# Patient Record
Sex: Male | Born: 1960 | Race: Black or African American | Hispanic: No | Marital: Single | Smoking: Former smoker
Health system: Southern US, Community
[De-identification: ages and names within clinical notes are randomized; demographics above are authoritative.]

## PROBLEM LIST (undated history)

## (undated) DIAGNOSIS — F1911 Other psychoactive substance abuse, in remission: Secondary | ICD-10-CM

## (undated) DIAGNOSIS — J45909 Unspecified asthma, uncomplicated: Secondary | ICD-10-CM

## (undated) DIAGNOSIS — J449 Chronic obstructive pulmonary disease, unspecified: Secondary | ICD-10-CM

## (undated) DIAGNOSIS — F32A Depression, unspecified: Secondary | ICD-10-CM

## (undated) DIAGNOSIS — G8929 Other chronic pain: Secondary | ICD-10-CM

## (undated) DIAGNOSIS — K219 Gastro-esophageal reflux disease without esophagitis: Secondary | ICD-10-CM

## (undated) DIAGNOSIS — K635 Polyp of colon: Secondary | ICD-10-CM

## (undated) DIAGNOSIS — I10 Essential (primary) hypertension: Secondary | ICD-10-CM

## (undated) DIAGNOSIS — F329 Major depressive disorder, single episode, unspecified: Secondary | ICD-10-CM

## (undated) DIAGNOSIS — Z87828 Personal history of other (healed) physical injury and trauma: Secondary | ICD-10-CM

## (undated) DIAGNOSIS — M542 Cervicalgia: Secondary | ICD-10-CM

## (undated) HISTORY — PX: CERVICAL FUSION: SHX112

## (undated) HISTORY — PX: OTHER SURGICAL HISTORY: SHX169

---

## 2006-06-06 ENCOUNTER — Ambulatory Visit: Payer: Self-pay | Admitting: Family Medicine

## 2006-06-08 ENCOUNTER — Ambulatory Visit: Payer: Self-pay | Admitting: *Deleted

## 2006-07-28 ENCOUNTER — Ambulatory Visit: Payer: Self-pay | Admitting: Family Medicine

## 2006-08-08 ENCOUNTER — Ambulatory Visit: Payer: Self-pay | Admitting: Family Medicine

## 2006-09-01 ENCOUNTER — Ambulatory Visit: Payer: Self-pay | Admitting: Internal Medicine

## 2007-02-01 ENCOUNTER — Emergency Department (HOSPITAL_COMMUNITY): Admission: EM | Admit: 2007-02-01 | Discharge: 2007-02-01 | Payer: Self-pay | Admitting: *Deleted

## 2007-02-13 ENCOUNTER — Ambulatory Visit: Payer: Self-pay | Admitting: Internal Medicine

## 2007-02-13 LAB — CONVERTED CEMR LAB
ALT: 22 units/L (ref 0–53)
AST: 14 units/L (ref 0–37)
Alkaline Phosphatase: 105 units/L (ref 39–117)
Basophils Absolute: 0 10*3/uL (ref 0.0–0.1)
Basophils Relative: 0 % (ref 0–1)
Chloride: 110 meq/L (ref 96–112)
Creatinine, Ser: 0.98 mg/dL (ref 0.40–1.50)
Eosinophils Absolute: 0.1 10*3/uL (ref 0.0–0.7)
HDL: 51 mg/dL (ref >39)
Hemoglobin: 13.4 g/dL (ref 13.0–17.0)
LDL Cholesterol: 103 mg/dL — ABNORMAL HIGH (ref 0–99)
MCHC: 30.9 g/dL (ref 30.0–36.0)
MCV: 91.2 fL (ref 78.0–100.0)
Monocytes Absolute: 0.5 10*3/uL (ref 0.2–0.7)
Neutro Abs: 5.7 10*3/uL (ref 1.7–7.7)
RDW: 13.2 % (ref 11.5–14.0)
Total Bilirubin: 0.5 mg/dL (ref 0.3–1.2)
Total CHOL/HDL Ratio: 3.5
VLDL: 26 mg/dL (ref 0–40)

## 2007-02-22 ENCOUNTER — Encounter (INDEPENDENT_AMBULATORY_CARE_PROVIDER_SITE_OTHER): Payer: Self-pay | Admitting: *Deleted

## 2007-04-17 ENCOUNTER — Ambulatory Visit: Payer: Self-pay | Admitting: Internal Medicine

## 2007-07-17 ENCOUNTER — Ambulatory Visit: Payer: Self-pay | Admitting: Internal Medicine

## 2007-08-25 ENCOUNTER — Emergency Department (HOSPITAL_COMMUNITY): Admission: EM | Admit: 2007-08-25 | Discharge: 2007-08-25 | Payer: Self-pay | Admitting: Emergency Medicine

## 2007-09-25 ENCOUNTER — Ambulatory Visit: Payer: Self-pay | Admitting: Internal Medicine

## 2007-09-25 ENCOUNTER — Ambulatory Visit (HOSPITAL_COMMUNITY): Admission: RE | Admit: 2007-09-25 | Discharge: 2007-09-25 | Payer: Self-pay | Admitting: Family Medicine

## 2007-10-05 ENCOUNTER — Emergency Department (HOSPITAL_COMMUNITY): Admission: EM | Admit: 2007-10-05 | Discharge: 2007-10-05 | Payer: Self-pay | Admitting: Emergency Medicine

## 2007-11-02 ENCOUNTER — Ambulatory Visit: Payer: Self-pay | Admitting: Internal Medicine

## 2007-12-18 ENCOUNTER — Emergency Department (HOSPITAL_COMMUNITY): Admission: EM | Admit: 2007-12-18 | Discharge: 2007-12-18 | Payer: Self-pay | Admitting: Emergency Medicine

## 2007-12-28 ENCOUNTER — Ambulatory Visit: Admission: RE | Admit: 2007-12-28 | Discharge: 2007-12-28 | Payer: Self-pay | Admitting: Internal Medicine

## 2007-12-28 ENCOUNTER — Ambulatory Visit: Payer: Self-pay | Admitting: Internal Medicine

## 2008-03-12 ENCOUNTER — Ambulatory Visit: Payer: Self-pay | Admitting: Internal Medicine

## 2008-05-15 ENCOUNTER — Emergency Department (HOSPITAL_COMMUNITY): Admission: EM | Admit: 2008-05-15 | Discharge: 2008-05-15 | Payer: Self-pay | Admitting: Emergency Medicine

## 2008-06-04 ENCOUNTER — Ambulatory Visit: Payer: Self-pay | Admitting: Internal Medicine

## 2008-07-24 ENCOUNTER — Encounter: Admission: RE | Admit: 2008-07-24 | Discharge: 2008-09-05 | Payer: Self-pay | Admitting: Internal Medicine

## 2008-07-24 ENCOUNTER — Ambulatory Visit: Payer: Self-pay | Admitting: Internal Medicine

## 2008-07-24 ENCOUNTER — Encounter (INDEPENDENT_AMBULATORY_CARE_PROVIDER_SITE_OTHER): Payer: Self-pay | Admitting: Adult Health

## 2008-07-24 LAB — CONVERTED CEMR LAB
AST: 20 units/L (ref 0–37)
Albumin: 4.5 g/dL (ref 3.5–5.2)
Alkaline Phosphatase: 110 units/L (ref 39–117)
LDL Cholesterol: 127 mg/dL — ABNORMAL HIGH (ref 0–99)
PSA: 1.17 ng/mL (ref 0.10–4.00)
Potassium: 4.2 meq/L (ref 3.5–5.3)
Sodium: 141 meq/L (ref 135–145)
Total Bilirubin: 0.5 mg/dL (ref 0.3–1.2)
Total Protein: 7.2 g/dL (ref 6.0–8.3)
VLDL: 13 mg/dL (ref 0–40)

## 2008-10-02 ENCOUNTER — Ambulatory Visit: Payer: Self-pay | Admitting: Adult Health

## 2009-05-23 ENCOUNTER — Emergency Department (HOSPITAL_COMMUNITY): Admission: EM | Admit: 2009-05-23 | Discharge: 2009-05-24 | Payer: Self-pay | Admitting: Emergency Medicine

## 2009-11-12 ENCOUNTER — Ambulatory Visit: Payer: Self-pay | Admitting: Internal Medicine

## 2009-11-12 ENCOUNTER — Encounter (INDEPENDENT_AMBULATORY_CARE_PROVIDER_SITE_OTHER): Payer: Self-pay | Admitting: Adult Health

## 2009-11-12 LAB — CONVERTED CEMR LAB
Albumin: 4.5 g/dL (ref 3.5–5.2)
Alkaline Phosphatase: 116 units/L (ref 39–117)
BUN: 23 mg/dL (ref 6–23)
CO2: 23 meq/L (ref 19–32)
Cholesterol: 199 mg/dL (ref 0–200)
Glucose, Bld: 110 mg/dL — ABNORMAL HIGH (ref 70–99)
HDL: 49 mg/dL (ref >39)
LDL Cholesterol: 132 mg/dL — ABNORMAL HIGH (ref 0–99)
Potassium: 3.8 meq/L (ref 3.5–5.3)
Sodium: 138 meq/L (ref 135–145)
Total Bilirubin: 0.5 mg/dL (ref 0.3–1.2)
Total Protein: 7.6 g/dL (ref 6.0–8.3)
Triglycerides: 91 mg/dL (ref <150)

## 2009-12-01 ENCOUNTER — Ambulatory Visit (HOSPITAL_COMMUNITY): Admission: RE | Admit: 2009-12-01 | Discharge: 2009-12-01 | Payer: Self-pay | Admitting: Internal Medicine

## 2009-12-01 LAB — PULMONARY FUNCTION TEST

## 2009-12-31 ENCOUNTER — Ambulatory Visit: Payer: Self-pay | Admitting: Internal Medicine

## 2010-02-17 ENCOUNTER — Ambulatory Visit: Payer: Self-pay | Admitting: Internal Medicine

## 2010-09-07 LAB — GLUCOSE, CAPILLARY

## 2011-03-12 LAB — URINALYSIS, ROUTINE W REFLEX MICROSCOPIC
Glucose, UA: NEGATIVE mg/dL
Nitrite: NEGATIVE
Specific Gravity, Urine: 1.019 (ref 1.005–1.030)
pH: 7.5 (ref 5.0–8.0)

## 2011-03-12 LAB — URINE MICROSCOPIC-ADD ON

## 2011-03-19 LAB — DIFFERENTIAL
Eosinophils Relative: 0
Lymphocytes Relative: 13
Lymphs Abs: 1.1
Monocytes Relative: 7

## 2011-03-19 LAB — I-STAT 8, (EC8 V) (CONVERTED LAB)
BUN: 19
Chloride: 107
Glucose, Bld: 95
Potassium: 3.3 — ABNORMAL LOW
Sodium: 138
TCO2: 22
pH, Ven: 7.529 — ABNORMAL HIGH

## 2011-03-19 LAB — CBC
HCT: 38.6 — ABNORMAL LOW
Platelets: 213
RBC: 4.36
WBC: 7.9

## 2012-04-26 ENCOUNTER — Emergency Department (INDEPENDENT_AMBULATORY_CARE_PROVIDER_SITE_OTHER)
Admission: EM | Admit: 2012-04-26 | Discharge: 2012-04-26 | Disposition: A | Discharge status: Home / Self Care | Payer: Medicare Other | Source: Home / Self Care

## 2012-04-26 ENCOUNTER — Encounter (HOSPITAL_COMMUNITY): Payer: Self-pay

## 2012-04-26 DIAGNOSIS — I10 Essential (primary) hypertension: Secondary | ICD-10-CM

## 2012-04-26 DIAGNOSIS — J45909 Unspecified asthma, uncomplicated: Secondary | ICD-10-CM | POA: Insufficient documentation

## 2012-04-26 DIAGNOSIS — K635 Polyp of colon: Secondary | ICD-10-CM | POA: Insufficient documentation

## 2012-04-26 DIAGNOSIS — K219 Gastro-esophageal reflux disease without esophagitis: Secondary | ICD-10-CM

## 2012-04-26 DIAGNOSIS — F329 Major depressive disorder, single episode, unspecified: Secondary | ICD-10-CM

## 2012-04-26 DIAGNOSIS — G8929 Other chronic pain: Secondary | ICD-10-CM | POA: Insufficient documentation

## 2012-04-26 HISTORY — DX: Polyp of colon: K63.5

## 2012-04-26 HISTORY — DX: Gastro-esophageal reflux disease without esophagitis: K21.9

## 2012-04-26 HISTORY — DX: Depression, unspecified: F32.A

## 2012-04-26 HISTORY — DX: Essential (primary) hypertension: I10

## 2012-04-26 HISTORY — DX: Other chronic pain: G89.29

## 2012-04-26 HISTORY — DX: Unspecified asthma, uncomplicated: J45.909

## 2012-04-26 HISTORY — DX: Cervicalgia: M54.2

## 2012-04-26 HISTORY — DX: Major depressive disorder, single episode, unspecified: F32.9

## 2012-04-26 HISTORY — DX: Other psychoactive substance abuse, in remission: F19.11

## 2012-04-26 MED ORDER — RANITIDINE HCL 150 MG PO TABS: 150.0000 mg | ORAL_TABLET | Freq: Two times a day (BID) | ORAL | Status: AC

## 2012-04-26 MED ORDER — TADALAFIL 10 MG PO TABS: 10.0000 mg | ORAL_TABLET | Freq: Every day | ORAL | Status: AC | PRN

## 2012-04-26 MED ORDER — TRAMADOL HCL 50 MG PO TABS: 50.0000 mg | ORAL_TABLET | Freq: Four times a day (QID) | ORAL | Status: AC | PRN

## 2012-04-26 MED ORDER — DULOXETINE HCL 20 MG PO CPEP: 20.0000 mg | ORAL_CAPSULE | Freq: Every day | ORAL | Status: DC

## 2012-04-26 MED ORDER — TRAZODONE HCL 100 MG PO TABS: 100.0000 mg | ORAL_TABLET | Freq: Every day | ORAL | Status: AC

## 2012-04-26 MED ORDER — HYDROXYZINE HCL 25 MG PO TABS: 25.0000 mg | ORAL_TABLET | Freq: Three times a day (TID) | ORAL | Status: DC | PRN

## 2012-04-26 MED ORDER — QUETIAPINE FUMARATE 50 MG PO TABS: 50.0000 mg | ORAL_TABLET | Freq: Every day | ORAL | Status: DC

## 2012-04-26 NOTE — ED Provider Notes (Signed)
History     CSN: [id number]  Arrival date & time 04/26/12  4098   Chief Complaint  Patient presents with  . Medication Management    HPI Pt reports that he needs refills of his medications.  He has been in prison for last 11 months but says that he was going to [facility] prior to going to prison.    Pt also requesting prescription for cialis.  He says that he has been taking viagra but has been having headaches about 1 hour after taking drug.  Says that he has taken cialis in the past and had no headaches.    Past Medical History  Diagnosis Date  . Depressed   . Asthma     History reviewed.   History reviewed.    History  Substance Use Topics  . Smoking status: Not on file  . Smokeless tobacco: Not on file  . Alcohol Use:     Review of Systems  Constitutional: Negative.   HENT: Positive for neck stiffness.   Eyes: Negative.   Respiratory: Negative.   Cardiovascular: Negative.   Gastrointestinal: Negative.   Genitourinary: Negative for urgency, frequency, hematuria, scrotal swelling, genital sores and testicular pain.  Musculoskeletal:       Chronic neck pain and stiffness Chronic joint pain  Neurological: Negative.   Hematological: Negative.   Psychiatric/Behavioral: Negative.     Allergies  Review of patient's allergies indicates no known allergies.  Home Medications   Current Outpatient Rx  Name  Route  Sig  Dispense  Refill  . DULOXETINE HCL 30 MG PO CPEP   Oral   Take 30 mg by mouth daily.         Marland Kitchen FLUTICASONE-SALMETEROL 230-21 MCG/ACT IN AERO   Inhalation   Inhale 2 puffs into the lungs 2 (two) times daily.         . TRAMADOL HCL 50 MG PO TABS   Oral   Take 50 mg by mouth every 6 (six) hours as needed.         . TRAZODONE HCL 100 MG PO TABS   Oral   Take 100 mg by mouth at bedtime.           BP 142/97  Pulse 82  Temp 98 F (36.7 C) (Oral)  Resp 18  SpO2 97%  Physical Exam  Constitutional: He is oriented to person, place,  and time. He appears well-developed and well-nourished. No distress.  HENT:  Head: Normocephalic and atraumatic.  Eyes: Pupils are equal, round, and reactive to light.  Neck: No tracheal deviation present. No thyromegaly present.  Cardiovascular: Normal rate and regular rhythm.   Pulmonary/Chest: Effort normal and breath sounds normal.  Abdominal: Soft. Bowel sounds are normal. He exhibits no distension and no mass. There is no tenderness. There is no rebound and no guarding.  Musculoskeletal: He exhibits tenderness.       Neck and joint tenderness    Neurological: He is alert and oriented to person, place, and time. He has normal reflexes. No cranial nerve deficit. Coordination normal.  Skin: Skin is warm and dry. No rash noted. No erythema.  Psychiatric: He has a normal mood and affect. His behavior is normal. Judgment and thought content normal.    ED Course  Procedures (including critical care time)  Labs Reviewed - No data to display No results found.   No diagnosis found.  MDM   IMPRESSION  Chronic Neck Pain and Knee Pain  Chronic Insomnia  Asthma  S/p cervical spine fusion  Chronic OA  GERD  Erectile Dysfunction  Elevated blood pressure  Elevated blood sugar  RECOMMENDATIONS / PLAN  Refilled chronic home medications [facility]ed [facility] Counseled on exercise and diet The patient was given clear instructions to go to ER or return to medical center if symptoms don't improve, worsen or new problems develop.  The patient verbalized understanding.  The patient was told to call to get lab results if they haven't heard anything in the next week.   Trial of cialis 10 mg Zantac 150 mg bid   FOLLOW UP 2 weeks for BP follow up  Check labs on follow up visit and check blood sugar at that time   The patient was given clear instructions to go to ER or return to medical center if symptoms don't improve, worsen or new problems develop.  The  patient verbalized understanding.  The patient was told to call to get lab results if they haven't heard anything in the next week.            Cleora Fleet, MD 04/26/12 [phone]

## 2012-04-26 NOTE — ED Notes (Signed)
Here for medication refills; NAD; speaking in complete sentences

## 2013-07-31 ENCOUNTER — Emergency Department (INDEPENDENT_AMBULATORY_CARE_PROVIDER_SITE_OTHER)
Admission: EM | Admit: 2013-07-31 | Discharge: 2013-07-31 | Disposition: A | Discharge status: Home / Self Care | Payer: Medicare Other | Source: Home / Self Care | Attending: Family Medicine | Admitting: Family Medicine

## 2013-07-31 ENCOUNTER — Encounter (HOSPITAL_COMMUNITY): Payer: Self-pay | Admitting: Emergency Medicine

## 2013-07-31 DIAGNOSIS — M542 Cervicalgia: Secondary | ICD-10-CM

## 2013-07-31 DIAGNOSIS — Z79899 Other long term (current) drug therapy: Secondary | ICD-10-CM

## 2013-07-31 LAB — GLUCOSE, CAPILLARY: Glucose-Capillary: 98 mg/dL (ref 70–99)

## 2013-07-31 MED ORDER — TRAMADOL HCL 50 MG PO TABS: 50.0000 mg | ORAL_TABLET | Freq: Four times a day (QID) | ORAL | Status: AC | PRN

## 2013-07-31 NOTE — ED Notes (Signed)
Pt    Presents  Here        Reports      He  Is  Here  forv a  Refill  Of  His  meds   He  Has  Not  Been following  Up  With  His  pcp            He  States he  Is  Here  For  Chronic  Neck  Pain  And he  Does  Report  Mild  dizzyness  At times

## 2013-07-31 NOTE — Discharge Instructions (Signed)
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
## 2013-08-01 NOTE — ED Provider Notes (Signed)
Medical screening examination/treatment/procedure(s) were performed by a resident physician or non-physician practitioner and as the supervising physician I was immediately available for consultation/collaboration.  Evan Corey, MD    Evan S Corey, MD 08/01/13 0748 

## 2015-03-17 ENCOUNTER — Emergency Department (INDEPENDENT_AMBULATORY_CARE_PROVIDER_SITE_OTHER)
Admission: EM | Admit: 2015-03-17 | Discharge: 2015-03-17 | Disposition: A | Discharge status: Home / Self Care | Payer: Medicare Other | Source: Home / Self Care | Attending: Family Medicine | Admitting: Family Medicine

## 2015-03-17 ENCOUNTER — Encounter (HOSPITAL_COMMUNITY): Payer: Self-pay | Admitting: Emergency Medicine

## 2015-03-17 DIAGNOSIS — H0013 Chalazion right eye, unspecified eyelid: Secondary | ICD-10-CM

## 2015-03-17 MED ORDER — ERYTHROMYCIN 5 MG/GM OP OINT: TOPICAL_OINTMENT | OPHTHALMIC | Status: DC

## 2015-03-17 NOTE — Discharge Instructions (Signed)
Chalazion A chalazion is a swelling or lump on the eyelid. It can affect the upper or lower eyelid. CAUSES This condition may be caused by:  Long-lasting (chronic) inflammation of the eyelid glands.  A blocked oil gland in the eyelid. SYMPTOMS Symptoms of this condition include:  A swelling on the eyelid. The swelling may spread to areas around the eye.  A hard lump on the eyelid. This lump may make it hard to see out of the eye. DIAGNOSIS This condition is diagnosed with an examination of the eye. TREATMENT This condition is treated by applying a warm compress to the eyelid. If the condition does not improve after two days, it may be treated with:  Surgery.  Medicine that is injected into the chalazion by a health care provider.  Medicine that is applied to the eye. HOME CARE INSTRUCTIONS  Do not touch the chalazion.  Do not try to remove the pus, such as by squeezing the chalazion or sticking it with a pin or needle.  Do not rub your eyes.  Wash your hands often. Dry your hands with a clean towel.  Keep your face, scalp, and eyebrows clean.  Avoid wearing eye makeup.  Apply a warm, moist compress to the eyelid 4-6 times a day for 10-15 minutes at a time. This will help to open any blocked glands and help to reduce redness and swelling.  Apply over-the-counter and prescription medicines only as told by your health care provider.  If the chalazion does not break open (rupture) on its own in a month, return to your health care provider.  Keep all follow-up appointments as told by your health care provider. This is important. SEEK MEDICAL CARE IF:  Your eyelid has not improved in 4 weeks.  Your eyelid is getting worse.  You have a fever.  The chalazion does not rupture on its own with home treatment in a month. SEEK IMMEDIATE MEDICAL CARE IF:  You have pain in your eye.  Your vision changes.  The chalazion becomes painful or red  The chalazion gets  bigger.   This information is not intended to replace advice given to you by your health care provider. Make sure you discuss any questions you have with your health care provider.   Document Released: 05/21/2000 Document Revised: 02/12/2015 Document Reviewed: 09/16/2014 Elsevier Interactive Patient Education 2016 [employer]  
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
## 2015-03-17 NOTE — ED Notes (Signed)
Sty to upper eyelid, right eye.  Reports occurred a month ago, then reoccurred.

## 2015-03-19 DIAGNOSIS — H2513 Age-related nuclear cataract, bilateral: Secondary | ICD-10-CM | POA: Diagnosis not present

## 2015-03-19 DIAGNOSIS — H524 Presbyopia: Secondary | ICD-10-CM | POA: Diagnosis not present

## 2015-03-19 DIAGNOSIS — H0011 Chalazion right upper eyelid: Secondary | ICD-10-CM | POA: Diagnosis not present

## 2015-03-19 DIAGNOSIS — H0012 Chalazion right lower eyelid: Secondary | ICD-10-CM | POA: Diagnosis not present

## 2015-03-19 DIAGNOSIS — H52201 Unspecified astigmatism, right eye: Secondary | ICD-10-CM | POA: Diagnosis not present

## 2015-03-19 DIAGNOSIS — H5203 Hypermetropia, bilateral: Secondary | ICD-10-CM | POA: Diagnosis not present

## 2015-04-02 DIAGNOSIS — H0011 Chalazion right upper eyelid: Secondary | ICD-10-CM | POA: Diagnosis not present

## 2016-03-05 ENCOUNTER — Ambulatory Visit (HOSPITAL_COMMUNITY)
Admission: EM | Admit: 2016-03-05 | Discharge: 2016-03-05 | Disposition: A | Discharge status: Home / Self Care | Payer: Medicare Other | Attending: Family Medicine | Admitting: Family Medicine

## 2016-03-05 ENCOUNTER — Encounter (HOSPITAL_COMMUNITY): Payer: Self-pay | Admitting: Emergency Medicine

## 2016-03-05 DIAGNOSIS — L0291 Cutaneous abscess, unspecified: Secondary | ICD-10-CM | POA: Diagnosis not present
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
## 2016-03-05 NOTE — ED Triage Notes (Signed)
Patient states that he has abscess on his head States these are reoccurring abscess  Have not seen a provider about them Does have discharge coming out Would like a referral to dermatologist

## 2016-04-19 DIAGNOSIS — Z1389 Encounter for screening for other disorder: Secondary | ICD-10-CM | POA: Diagnosis not present

## 2016-04-19 DIAGNOSIS — G44209 Tension-type headache, unspecified, not intractable: Secondary | ICD-10-CM | POA: Diagnosis not present

## 2016-04-19 DIAGNOSIS — R030 Elevated blood-pressure reading, without diagnosis of hypertension: Secondary | ICD-10-CM | POA: Diagnosis not present

## 2016-04-19 DIAGNOSIS — M4322 Fusion of spine, cervical region: Secondary | ICD-10-CM | POA: Diagnosis not present

## 2016-04-19 DIAGNOSIS — B350 Tinea barbae and tinea capitis: Secondary | ICD-10-CM | POA: Diagnosis not present

## 2016-04-19 DIAGNOSIS — K219 Gastro-esophageal reflux disease without esophagitis: Secondary | ICD-10-CM | POA: Diagnosis not present

## 2016-04-19 DIAGNOSIS — Z6836 Body mass index (BMI) 36.0-36.9, adult: Secondary | ICD-10-CM | POA: Diagnosis not present

## 2016-05-14 DIAGNOSIS — B350 Tinea barbae and tinea capitis: Secondary | ICD-10-CM | POA: Diagnosis not present

## 2016-05-14 DIAGNOSIS — K219 Gastro-esophageal reflux disease without esophagitis: Secondary | ICD-10-CM | POA: Diagnosis not present

## 2016-05-14 DIAGNOSIS — L219 Seborrheic dermatitis, unspecified: Secondary | ICD-10-CM | POA: Diagnosis not present

## 2016-05-14 DIAGNOSIS — G8921 Chronic pain due to trauma: Secondary | ICD-10-CM | POA: Diagnosis not present

## 2016-06-11 DIAGNOSIS — R030 Elevated blood-pressure reading, without diagnosis of hypertension: Secondary | ICD-10-CM | POA: Diagnosis not present

## 2016-06-11 DIAGNOSIS — E785 Hyperlipidemia, unspecified: Secondary | ICD-10-CM | POA: Diagnosis not present

## 2016-06-11 DIAGNOSIS — E118 Type 2 diabetes mellitus with unspecified complications: Secondary | ICD-10-CM | POA: Diagnosis not present

## 2016-07-30 DIAGNOSIS — R030 Elevated blood-pressure reading, without diagnosis of hypertension: Secondary | ICD-10-CM | POA: Diagnosis not present

## 2017-02-10 DIAGNOSIS — K648 Other hemorrhoids: Secondary | ICD-10-CM | POA: Diagnosis not present

## 2017-02-10 DIAGNOSIS — B350 Tinea barbae and tinea capitis: Secondary | ICD-10-CM | POA: Diagnosis not present

## 2017-02-10 DIAGNOSIS — L219 Seborrheic dermatitis, unspecified: Secondary | ICD-10-CM | POA: Diagnosis not present

## 2017-02-10 DIAGNOSIS — G44209 Tension-type headache, unspecified, not intractable: Secondary | ICD-10-CM | POA: Diagnosis not present

## 2017-02-10 DIAGNOSIS — M25561 Pain in right knee: Secondary | ICD-10-CM | POA: Diagnosis not present

## 2017-02-10 DIAGNOSIS — M5010 Cervical disc disorder with radiculopathy, unspecified cervical region: Secondary | ICD-10-CM | POA: Diagnosis not present

## 2017-02-10 DIAGNOSIS — E669 Obesity, unspecified: Secondary | ICD-10-CM | POA: Diagnosis not present

## 2017-02-10 DIAGNOSIS — G8921 Chronic pain due to trauma: Secondary | ICD-10-CM | POA: Diagnosis not present

## 2017-03-14 DIAGNOSIS — H35419 Lattice degeneration of retina, unspecified eye: Secondary | ICD-10-CM | POA: Diagnosis not present

## 2017-03-14 DIAGNOSIS — H2513 Age-related nuclear cataract, bilateral: Secondary | ICD-10-CM | POA: Diagnosis not present

## 2017-03-29 DIAGNOSIS — M542 Cervicalgia: Secondary | ICD-10-CM | POA: Diagnosis not present

## 2017-03-29 DIAGNOSIS — L219 Seborrheic dermatitis, unspecified: Secondary | ICD-10-CM | POA: Diagnosis not present

## 2017-03-29 DIAGNOSIS — Z79899 Other long term (current) drug therapy: Secondary | ICD-10-CM | POA: Diagnosis not present

## 2017-03-29 DIAGNOSIS — G44209 Tension-type headache, unspecified, not intractable: Secondary | ICD-10-CM | POA: Diagnosis not present

## 2017-03-29 DIAGNOSIS — M25561 Pain in right knee: Secondary | ICD-10-CM | POA: Diagnosis not present

## 2017-03-29 DIAGNOSIS — M5010 Cervical disc disorder with radiculopathy, unspecified cervical region: Secondary | ICD-10-CM | POA: Diagnosis not present

## 2017-03-29 DIAGNOSIS — K648 Other hemorrhoids: Secondary | ICD-10-CM | POA: Diagnosis not present

## 2017-03-29 DIAGNOSIS — G8921 Chronic pain due to trauma: Secondary | ICD-10-CM | POA: Diagnosis not present

## 2017-03-29 DIAGNOSIS — I10 Essential (primary) hypertension: Secondary | ICD-10-CM | POA: Diagnosis not present

## 2017-03-29 DIAGNOSIS — E669 Obesity, unspecified: Secondary | ICD-10-CM | POA: Diagnosis not present

## 2017-03-29 DIAGNOSIS — B350 Tinea barbae and tinea capitis: Secondary | ICD-10-CM | POA: Diagnosis not present

## 2017-04-13 ENCOUNTER — Emergency Department (HOSPITAL_COMMUNITY): Payer: Medicare HMO

## 2017-04-13 ENCOUNTER — Encounter (HOSPITAL_COMMUNITY): Payer: Self-pay | Admitting: Emergency Medicine

## 2017-04-13 ENCOUNTER — Emergency Department (HOSPITAL_COMMUNITY)
Admission: EM | Admit: 2017-04-13 | Discharge: 2017-04-13 | Disposition: A | Discharge status: Home / Self Care | Payer: Medicare HMO | Attending: Emergency Medicine | Admitting: Emergency Medicine

## 2017-04-13 DIAGNOSIS — Z87891 Personal history of nicotine dependence: Secondary | ICD-10-CM | POA: Insufficient documentation

## 2017-04-13 DIAGNOSIS — R0789 Other chest pain: Secondary | ICD-10-CM | POA: Insufficient documentation

## 2017-04-13 DIAGNOSIS — M542 Cervicalgia: Secondary | ICD-10-CM | POA: Insufficient documentation

## 2017-04-13 DIAGNOSIS — R079 Chest pain, unspecified: Secondary | ICD-10-CM | POA: Diagnosis not present

## 2017-04-13 DIAGNOSIS — G8929 Other chronic pain: Secondary | ICD-10-CM | POA: Insufficient documentation

## 2017-04-13 DIAGNOSIS — M549 Dorsalgia, unspecified: Secondary | ICD-10-CM | POA: Insufficient documentation

## 2017-04-13 DIAGNOSIS — I10 Essential (primary) hypertension: Secondary | ICD-10-CM | POA: Diagnosis not present

## 2017-04-13 DIAGNOSIS — R9431 Abnormal electrocardiogram [ECG] [EKG]: Secondary | ICD-10-CM | POA: Diagnosis not present

## 2017-04-13 HISTORY — DX: Personal history of other (healed) physical injury and trauma: Z87.828

## 2017-04-13 LAB — BASIC METABOLIC PANEL
ANION GAP: 7 (ref 5–15)
BUN: 14 mg/dL (ref 6–20)
CHLORIDE: 104 mmol/L (ref 101–111)
CO2: 27 mmol/L (ref 22–32)
CREATININE: 0.83 mg/dL (ref 0.61–1.24)
Calcium: 8.9 mg/dL (ref 8.9–10.3)
GFR calc non Af Amer: >60 mL/min (ref >60)
Glucose, Bld: 111 mg/dL — ABNORMAL HIGH (ref 65–99)
POTASSIUM: 4 mmol/L (ref 3.5–5.1)
SODIUM: 138 mmol/L (ref 135–145)

## 2017-04-13 LAB — CBC
HCT: 40.2 % (ref 39.0–52.0)
Hemoglobin: 12.8 g/dL — ABNORMAL LOW (ref 13.0–17.0)
MCH: 29 pg (ref 26.0–34.0)
MCHC: 31.8 g/dL (ref 30.0–36.0)
MCV: 91.2 fL (ref 78.0–100.0)
Platelets: 234 10*3/uL (ref 150–400)
RBC: 4.41 MIL/uL (ref 4.22–5.81)
RDW: 12.7 % (ref 11.5–15.5)
WBC: 10.8 10*3/uL — ABNORMAL HIGH (ref 4.0–10.5)

## 2017-04-13 LAB — I-STAT TROPONIN, ED: Troponin i, poc: 0 ng/mL (ref 0.00–0.08)

## 2017-04-13 MED ORDER — OXYCODONE-ACETAMINOPHEN 5-325 MG PO TABS: 1.0000 | ORAL_TABLET | ORAL | 0 refills | Status: DC | PRN

## 2017-04-13 MED ORDER — OXYCODONE-ACETAMINOPHEN 5-325 MG PO TABS
1.0000 | ORAL_TABLET | Freq: Once | ORAL | Status: AC
  Administered 2017-04-13: 1 via ORAL
  Filled 2017-04-13: qty 1
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
## 2017-04-13 NOTE — Discharge Instructions (Signed)

## 2017-04-13 NOTE — ED Triage Notes (Signed)
Patient reports neck pain that radiates to back and chest over the past several days. Patient taking Gabapentin and Tramadol and not helping with pains. Patient reports hard to move due to pain.

## 2017-04-13 NOTE — ED Notes (Signed)
Bed: WA23 Expected date:  Expected time:  Means of arrival:  Comments: 

## 2017-04-20 DIAGNOSIS — M5412 Radiculopathy, cervical region: Secondary | ICD-10-CM | POA: Diagnosis not present

## 2017-04-20 DIAGNOSIS — Z6836 Body mass index (BMI) 36.0-36.9, adult: Secondary | ICD-10-CM | POA: Diagnosis not present

## 2017-04-20 DIAGNOSIS — R030 Elevated blood-pressure reading, without diagnosis of hypertension: Secondary | ICD-10-CM | POA: Diagnosis not present

## 2017-04-21 ENCOUNTER — Other Ambulatory Visit: Payer: Self-pay | Admitting: Neurosurgery

## 2017-04-21 DIAGNOSIS — M5412 Radiculopathy, cervical region: Secondary | ICD-10-CM

## 2017-05-03 DIAGNOSIS — G8921 Chronic pain due to trauma: Secondary | ICD-10-CM | POA: Diagnosis not present

## 2017-05-03 DIAGNOSIS — M542 Cervicalgia: Secondary | ICD-10-CM | POA: Diagnosis not present

## 2017-05-03 DIAGNOSIS — I10 Essential (primary) hypertension: Secondary | ICD-10-CM | POA: Diagnosis not present

## 2017-05-04 ENCOUNTER — Other Ambulatory Visit: Payer: Medicare Other

## 2017-06-22 ENCOUNTER — Encounter: Payer: Self-pay | Admitting: Specialist

## 2018-02-11 ENCOUNTER — Other Ambulatory Visit: Payer: Self-pay

## 2018-02-11 ENCOUNTER — Encounter (HOSPITAL_COMMUNITY): Payer: Self-pay | Admitting: Emergency Medicine

## 2018-02-11 ENCOUNTER — Emergency Department (HOSPITAL_COMMUNITY)
Admission: EM | Admit: 2018-02-11 | Discharge: 2018-02-11 | Disposition: A | Discharge status: Home / Self Care | Payer: Medicare Other | Attending: Emergency Medicine | Admitting: Emergency Medicine

## 2018-02-11 ENCOUNTER — Emergency Department (HOSPITAL_COMMUNITY): Payer: Medicare Other

## 2018-02-11 DIAGNOSIS — Z87891 Personal history of nicotine dependence: Secondary | ICD-10-CM | POA: Diagnosis not present

## 2018-02-11 DIAGNOSIS — R1110 Vomiting, unspecified: Secondary | ICD-10-CM | POA: Diagnosis not present

## 2018-02-11 DIAGNOSIS — B349 Viral infection, unspecified: Secondary | ICD-10-CM | POA: Insufficient documentation

## 2018-02-11 DIAGNOSIS — Z79899 Other long term (current) drug therapy: Secondary | ICD-10-CM | POA: Insufficient documentation

## 2018-02-11 DIAGNOSIS — I10 Essential (primary) hypertension: Secondary | ICD-10-CM | POA: Diagnosis not present

## 2018-02-11 DIAGNOSIS — R509 Fever, unspecified: Secondary | ICD-10-CM | POA: Diagnosis present

## 2018-02-11 DIAGNOSIS — J45909 Unspecified asthma, uncomplicated: Secondary | ICD-10-CM | POA: Diagnosis not present

## 2018-02-11 LAB — CBC
HCT: 43.5 % (ref 39.0–52.0)
Hemoglobin: 13.3 g/dL (ref 13.0–17.0)
MCH: 28.5 pg (ref 26.0–34.0)
MCHC: 30.6 g/dL (ref 30.0–36.0)
MCV: 93.3 fL (ref 78.0–100.0)
PLATELETS: 183 10*3/uL (ref 150–400)
RBC: 4.66 MIL/uL (ref 4.22–5.81)
RDW: 12.4 % (ref 11.5–15.5)
WBC: 8 10*3/uL (ref 4.0–10.5)

## 2018-02-11 LAB — COMPREHENSIVE METABOLIC PANEL
ALK PHOS: 96 U/L (ref 38–126)
ALT: 23 U/L (ref 0–44)
AST: 21 U/L (ref 15–41)
Albumin: 3.9 g/dL (ref 3.5–5.0)
Anion gap: 11 (ref 5–15)
BUN: 22 mg/dL — ABNORMAL HIGH (ref 6–20)
CALCIUM: 9.4 mg/dL (ref 8.9–10.3)
CO2: 26 mmol/L (ref 22–32)
CREATININE: 1.07 mg/dL (ref 0.61–1.24)
Chloride: 105 mmol/L (ref 98–111)
Glucose, Bld: 126 mg/dL — ABNORMAL HIGH (ref 70–99)
Potassium: 3.9 mmol/L (ref 3.5–5.1)
Sodium: 142 mmol/L (ref 135–145)
Total Bilirubin: 0.6 mg/dL (ref 0.3–1.2)
Total Protein: 7.2 g/dL (ref 6.5–8.1)

## 2018-02-11 LAB — URINALYSIS, ROUTINE W REFLEX MICROSCOPIC
BILIRUBIN URINE: NEGATIVE
Glucose, UA: NEGATIVE mg/dL
HGB URINE DIPSTICK: NEGATIVE
Ketones, ur: NEGATIVE mg/dL
Leukocytes, UA: NEGATIVE
Nitrite: NEGATIVE
PROTEIN: NEGATIVE mg/dL
Specific Gravity, Urine: 1.02 (ref 1.005–1.030)
pH: 6 (ref 5.0–8.0)

## 2018-02-11 LAB — LIPASE, BLOOD: Lipase: 27 U/L (ref 11–51)

## 2018-02-11 MED ORDER — ONDANSETRON HCL 4 MG PO TABS: 4.0000 mg | ORAL_TABLET | Freq: Three times a day (TID) | ORAL | 0 refills | Status: AC | PRN

## 2018-02-11 MED ORDER — ACETAMINOPHEN 325 MG PO TABS
650.0000 mg | ORAL_TABLET | Freq: Once | ORAL | Status: AC
Start: 2018-02-11 — End: 2018-02-11
  Administered 2018-02-11: 650 mg via ORAL
  Filled 2018-02-11: qty 2

## 2018-02-11 MED ORDER — TRAMADOL HCL 50 MG PO TABS
50.0000 mg | ORAL_TABLET | Freq: Once | ORAL | Status: AC
  Administered 2018-02-11: 50 mg via ORAL
  Filled 2018-02-11: qty 1

## 2018-02-11 MED ORDER — ONDANSETRON 4 MG PO TBDP
4.0000 mg | ORAL_TABLET | Freq: Once | ORAL | Status: AC | PRN
  Administered 2018-02-11: 4 mg via ORAL
  Filled 2018-02-11: qty 1

## 2018-02-11 NOTE — Discharge Instructions (Addendum)
Return to the emergency room for any worsening or concerning symptoms including fast breathing, heart racing, confusion, vomiting. ° °Rest, cover your mouth when you cough and wash your hands frequently.  ° °Push fluids: water or Gatorade, do not drink any soda, juice or caffeinated beverages. ° °For fever and pain control you can take Motrin (ibuprofen) as follows: 400 mg (this is normally 2 over the counter pills) °Three hours after you have had the Motrin take Tylenol (acetaminophen) as follows: You can take 650 mg (this is normally 2 over-the-counter pills) °Repeat the series by taking Motrin 3 hours later, continue to do this while you are awake. ° °Check to see that any other over-the-counter medications don't contain acetaminophen: you shouldn't have more than 3000 mg a day. ° °Do not return to work until 48 hours after your fever breaks.  ° °

## 2018-02-11 NOTE — ED Triage Notes (Signed)
Pt reports waking up in the middle of the night with chills. Pt reported he felt warm and nauseous. Pt actively vomiting in triage. Denies pain.
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
## 2018-02-11 NOTE — ED Notes (Signed)
To x-ray

## 2018-03-04 ENCOUNTER — Encounter (HOSPITAL_COMMUNITY): Payer: Self-pay | Admitting: Emergency Medicine

## 2018-03-04 ENCOUNTER — Ambulatory Visit (HOSPITAL_COMMUNITY)
Admission: EM | Admit: 2018-03-04 | Discharge: 2018-03-04 | Disposition: A | Discharge status: Home / Self Care | Payer: Medicare Other | Attending: Family Medicine | Admitting: Family Medicine

## 2018-03-04 DIAGNOSIS — K0889 Other specified disorders of teeth and supporting structures: Secondary | ICD-10-CM | POA: Diagnosis not present

## 2018-03-04 MED ORDER — HYDROCODONE-ACETAMINOPHEN 5-325 MG PO TABS: 1.0000 | ORAL_TABLET | Freq: Four times a day (QID) | ORAL | 0 refills | Status: AC | PRN

## 2018-03-04 MED ORDER — CLINDAMYCIN HCL 300 MG PO CAPS: 300.0000 mg | ORAL_CAPSULE | Freq: Three times a day (TID) | ORAL | 0 refills | Status: AC

## 2018-03-04 NOTE — ED Triage Notes (Signed)
Pt c/o dental pain x4 weeks.

## 2018-03-04 NOTE — Discharge Instructions (Signed)
Be aware, pain medications may cause drowsiness. Please do not drive, operate heavy machinery or make important decisions while on this medication, it can cloud your judgement.  
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
## 2018-07-07 ENCOUNTER — Encounter (HOSPITAL_COMMUNITY): Payer: Self-pay | Admitting: Emergency Medicine

## 2018-07-07 ENCOUNTER — Ambulatory Visit (HOSPITAL_COMMUNITY)
Admission: EM | Admit: 2018-07-07 | Discharge: 2018-07-07 | Disposition: A | Discharge status: Home / Self Care | Payer: Medicare Other | Attending: Internal Medicine | Admitting: Internal Medicine

## 2018-07-07 ENCOUNTER — Other Ambulatory Visit: Payer: Self-pay

## 2018-07-07 DIAGNOSIS — J189 Pneumonia, unspecified organism: Secondary | ICD-10-CM

## 2018-07-07 MED ORDER — ACETAMINOPHEN 325 MG PO TABS
ORAL_TABLET | ORAL | Status: AC
  Filled 2018-07-07: qty 2

## 2018-07-07 MED ORDER — HYDROCOD POLST-CPM POLST ER 10-8 MG/5ML PO SUER: 5.0000 mL | Freq: Every evening | ORAL | 0 refills | Status: DC | PRN

## 2018-07-07 MED ORDER — ACETAMINOPHEN 325 MG PO TABS
650.0000 mg | ORAL_TABLET | Freq: Once | ORAL | Status: AC
  Administered 2018-07-07: 650 mg via ORAL

## 2018-07-07 MED ORDER — ALBUTEROL SULFATE HFA 108 (90 BASE) MCG/ACT IN AERS: 2.0000 | INHALATION_SPRAY | RESPIRATORY_TRACT | 0 refills | Status: DC | PRN

## 2018-07-07 MED ORDER — AMOXICILLIN-POT CLAVULANATE 875-125 MG PO TABS: 1.0000 | ORAL_TABLET | Freq: Two times a day (BID) | ORAL | 0 refills | Status: AC

## 2018-07-07 NOTE — ED Triage Notes (Signed)
PT reports cough productive of dark phlegm for 3 days. PT reports fever at home. Left ear pain as well.
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
## 2019-01-08 ENCOUNTER — Encounter (INDEPENDENT_AMBULATORY_CARE_PROVIDER_SITE_OTHER): Payer: Self-pay | Admitting: Ophthalmology

## 2019-01-17 ENCOUNTER — Other Ambulatory Visit: Payer: Self-pay

## 2019-01-17 ENCOUNTER — Encounter (INDEPENDENT_AMBULATORY_CARE_PROVIDER_SITE_OTHER): Payer: Medicare Other | Admitting: Ophthalmology

## 2019-01-17 DIAGNOSIS — H2513 Age-related nuclear cataract, bilateral: Secondary | ICD-10-CM | POA: Diagnosis not present

## 2019-01-17 DIAGNOSIS — H33301 Unspecified retinal break, right eye: Secondary | ICD-10-CM | POA: Diagnosis not present

## 2019-01-17 DIAGNOSIS — H31001 Unspecified chorioretinal scars, right eye: Secondary | ICD-10-CM | POA: Diagnosis not present

## 2019-01-17 DIAGNOSIS — H43813 Vitreous degeneration, bilateral: Secondary | ICD-10-CM

## 2019-01-24 ENCOUNTER — Encounter (INDEPENDENT_AMBULATORY_CARE_PROVIDER_SITE_OTHER): Payer: Medicare Other | Admitting: Ophthalmology

## 2019-01-29 ENCOUNTER — Encounter (INDEPENDENT_AMBULATORY_CARE_PROVIDER_SITE_OTHER): Payer: Medicare Other | Admitting: Ophthalmology

## 2019-03-08 IMAGING — DX DG CHEST 2V
2 series · 2 of 2 positions shown · non-contrast
Comparison: Chest x-ray dated April 13, 2017.

CLINICAL DATA: Cough and fever.

EXAM:
CHEST - 2 VIEW

[chest pa]
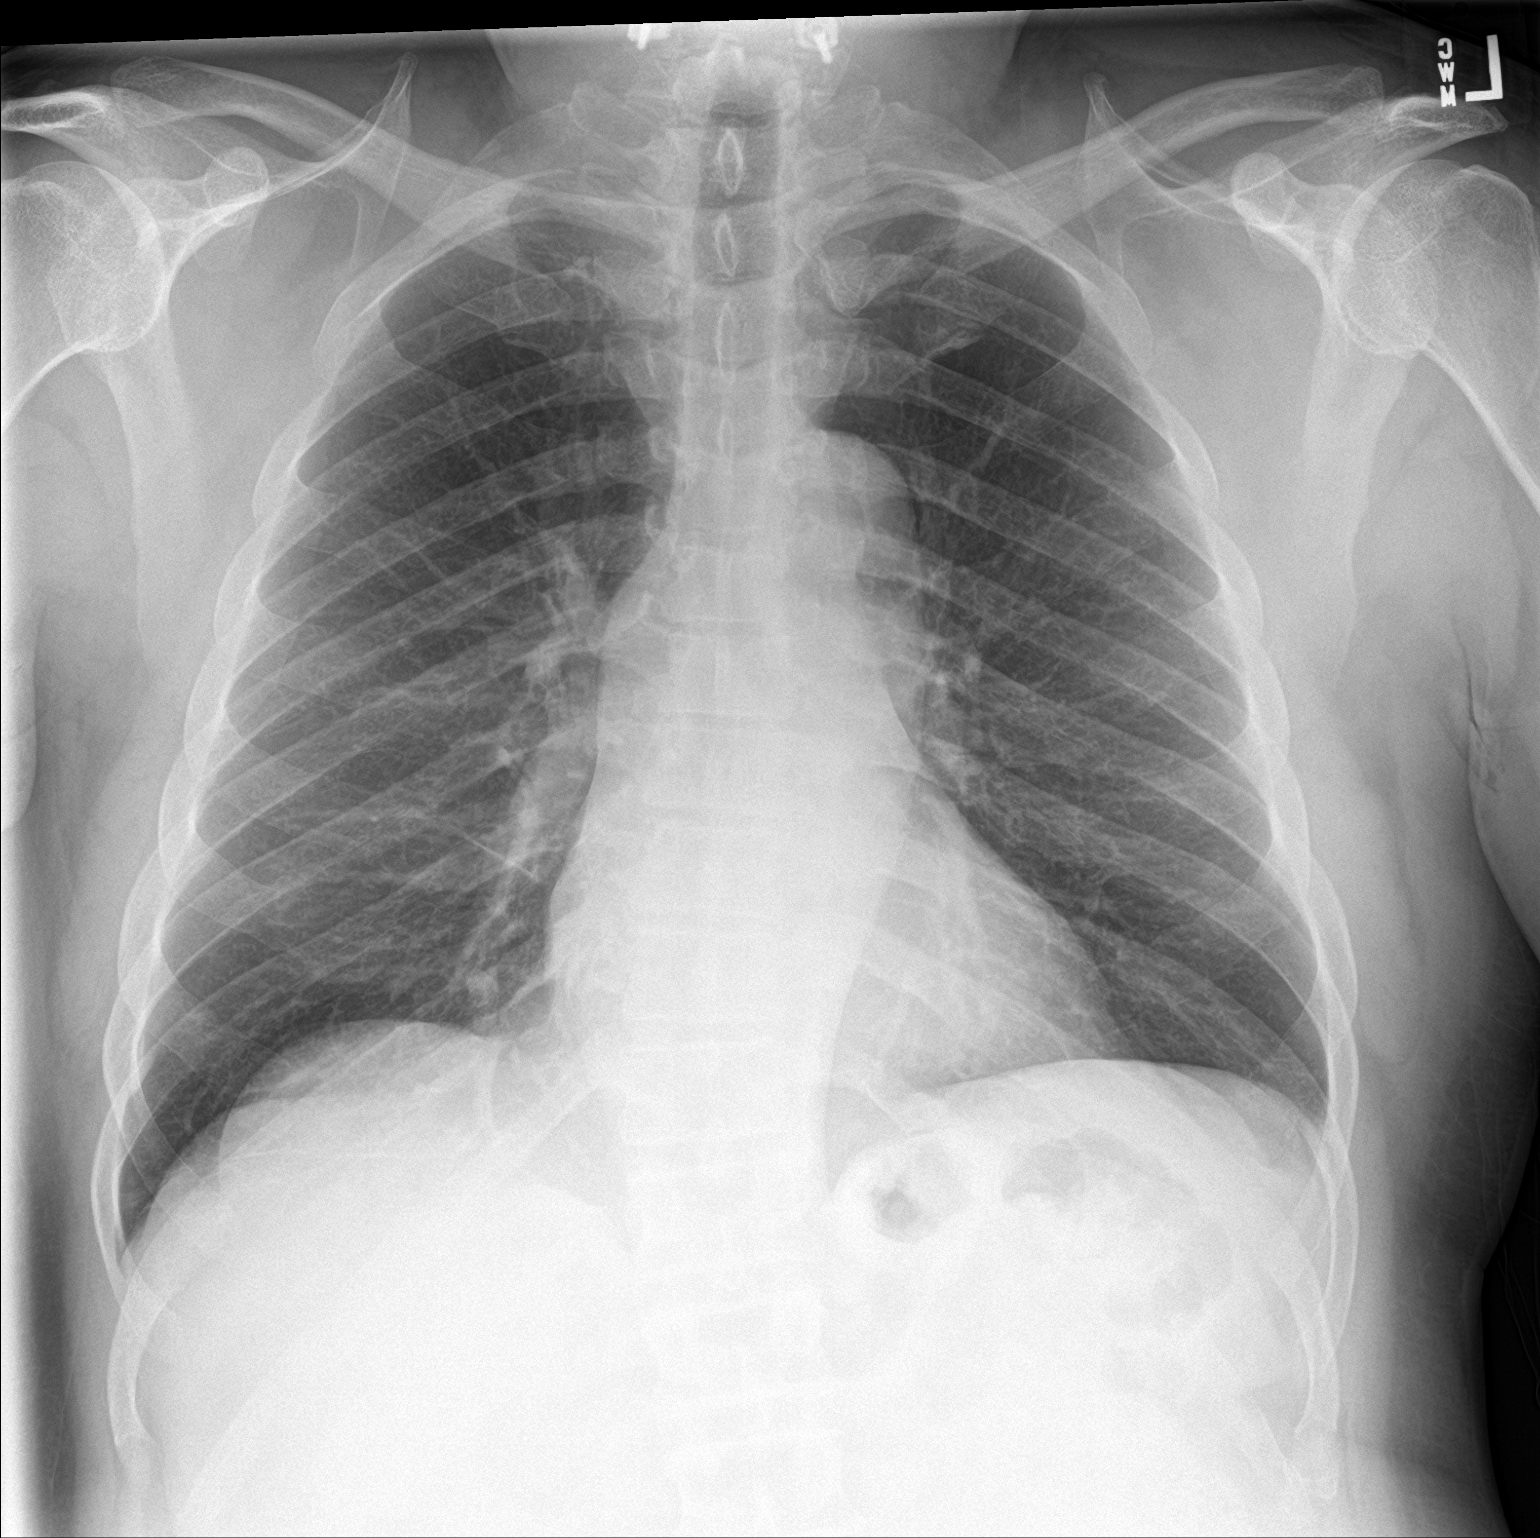

[chest lat]
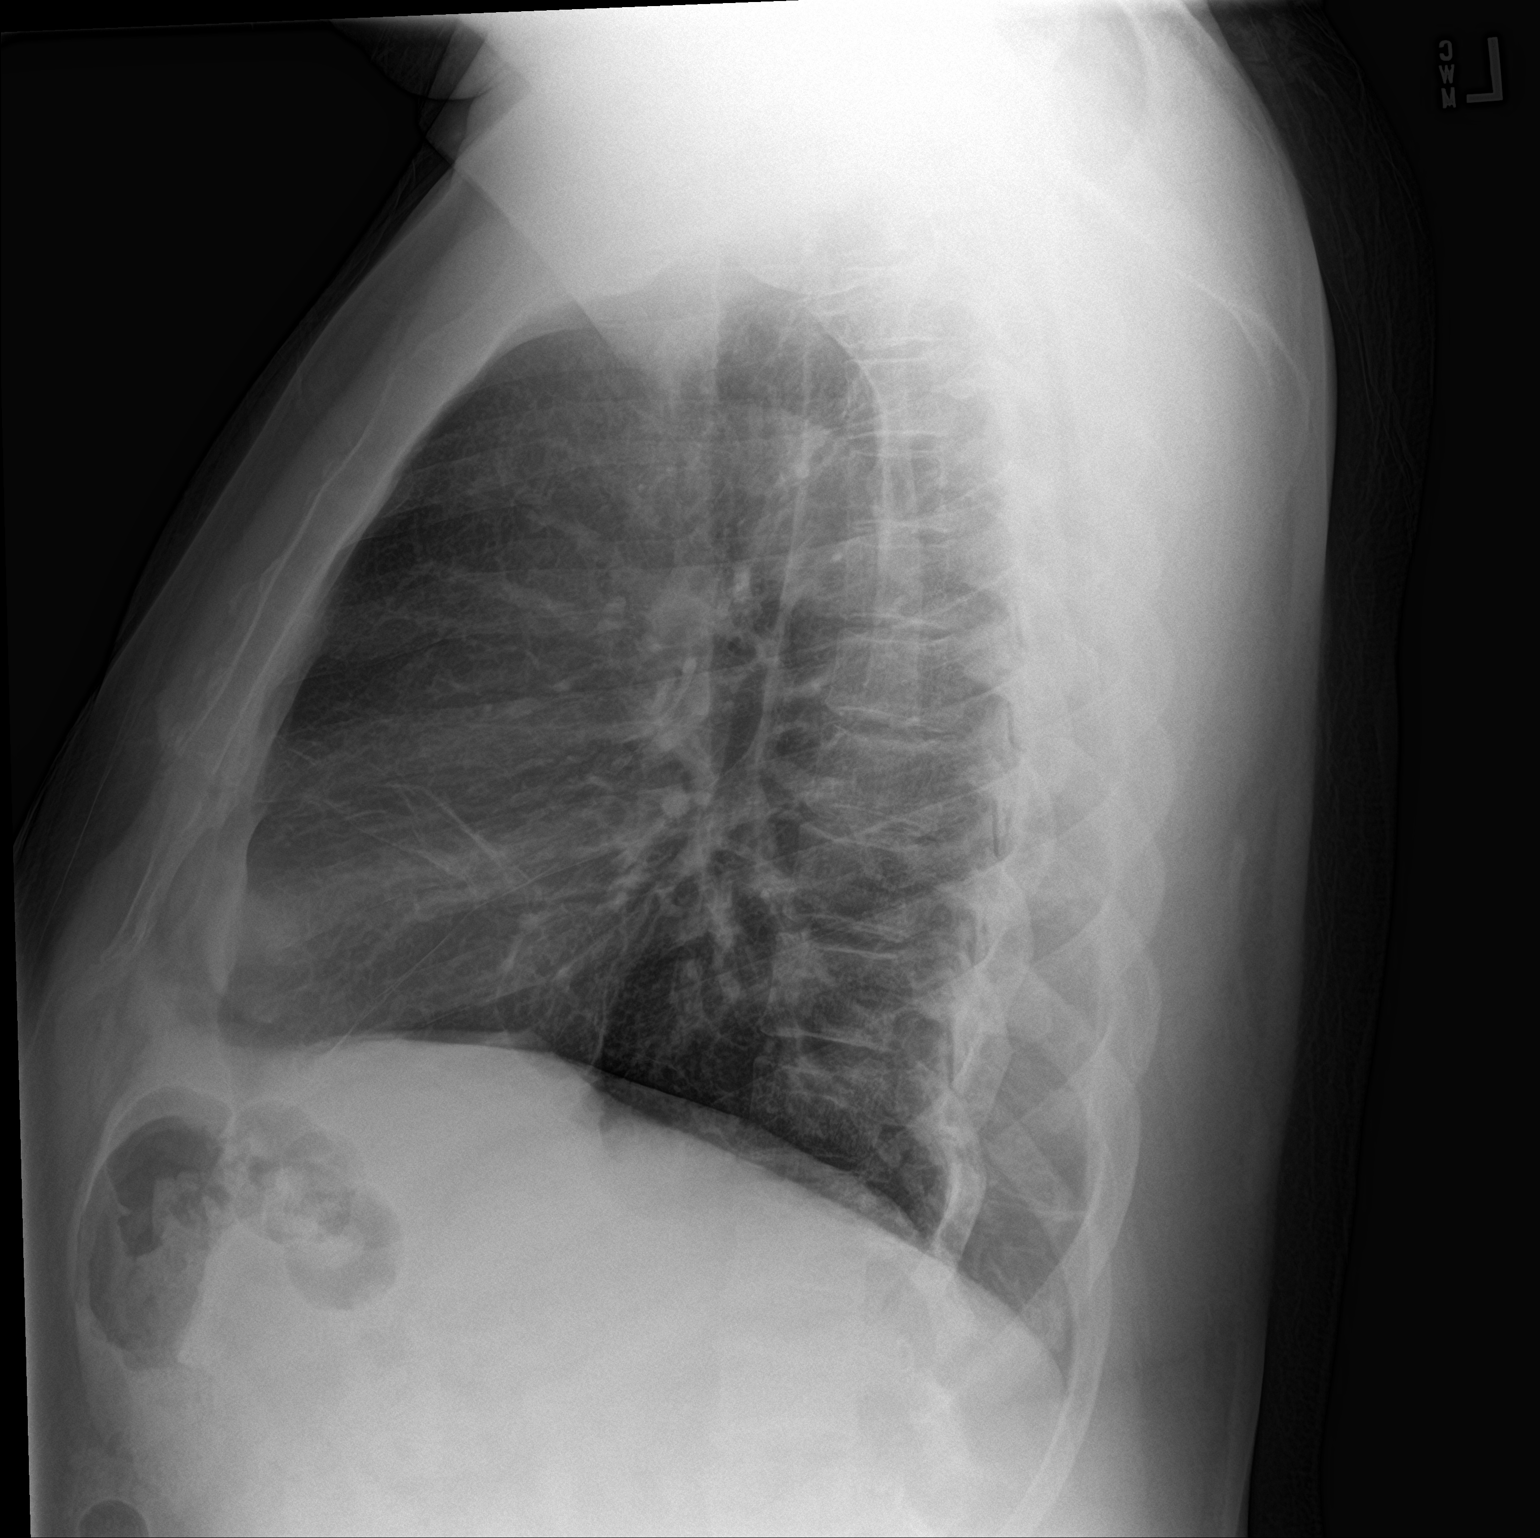

[2 of 2 positions shown; findings below may reference images not displayed]

FINDINGS: The heart size and mediastinal contours are within normal limits.
Normal pulmonary vascularity. Unchanged scarring at the lung bases.
No focal consolidation, pleural effusion, or pneumothorax. No acute
osseous abnormality.
IMPRESSION: No active cardiopulmonary disease.

## 2023-06-05 ENCOUNTER — Ambulatory Visit (HOSPITAL_COMMUNITY): Payer: Self-pay

## 2023-06-05 ENCOUNTER — Ambulatory Visit (INDEPENDENT_AMBULATORY_CARE_PROVIDER_SITE_OTHER): Payer: 59

## 2023-06-05 ENCOUNTER — Ambulatory Visit
Admission: RE | Admit: 2023-06-05 | Discharge: 2023-06-05 | Disposition: A | Discharge status: Home / Self Care | Payer: 59 | Source: Ambulatory Visit | Attending: Internal Medicine | Admitting: Internal Medicine

## 2023-06-05 VITALS — BP 165/109 | HR 98 | Temp 100.6°F | Resp 20 | Ht 69.0 in | Wt 210.0 lb

## 2023-06-05 DIAGNOSIS — J101 Influenza due to other identified influenza virus with other respiratory manifestations: Secondary | ICD-10-CM | POA: Diagnosis not present

## 2023-06-05 DIAGNOSIS — R051 Acute cough: Secondary | ICD-10-CM | POA: Diagnosis not present

## 2023-06-05 DIAGNOSIS — J441 Chronic obstructive pulmonary disease with (acute) exacerbation: Secondary | ICD-10-CM

## 2023-06-05 DIAGNOSIS — R0602 Shortness of breath: Secondary | ICD-10-CM | POA: Diagnosis not present

## 2023-06-05 DIAGNOSIS — J029 Acute pharyngitis, unspecified: Secondary | ICD-10-CM

## 2023-06-05 LAB — POC COVID19/FLU A&B COMBO
Covid Antigen, POC: NEGATIVE
Influenza A Antigen, POC: POSITIVE — AB
Influenza B Antigen, POC: NEGATIVE

## 2023-06-05 LAB — POCT RAPID STREP A (OFFICE): Rapid Strep A Screen: NEGATIVE

## 2023-06-05 MED ORDER — OSELTAMIVIR PHOSPHATE 75 MG PO CAPS: 75.0000 mg | ORAL_CAPSULE | Freq: Two times a day (BID) | ORAL | 0 refills | Status: AC

## 2023-06-05 MED ORDER — FLUTICASONE PROPIONATE HFA 110 MCG/ACT IN AERO: 1.0000 | INHALATION_SPRAY | Freq: Two times a day (BID) | RESPIRATORY_TRACT | 0 refills | Status: AC

## 2023-06-05 MED ORDER — PREDNISONE 20 MG PO TABS: 40.0000 mg | ORAL_TABLET | Freq: Every day | ORAL | 0 refills | Status: AC

## 2023-06-05 MED ORDER — IPRATROPIUM-ALBUTEROL 0.5-2.5 (3) MG/3ML IN SOLN
3.0000 mL | Freq: Once | RESPIRATORY_TRACT | Status: AC
  Administered 2023-06-05: 3 mL via RESPIRATORY_TRACT

## 2023-06-05 MED ORDER — AZITHROMYCIN 250 MG PO TABS: 250.0000 mg | ORAL_TABLET | Freq: Every day | ORAL | 0 refills | Status: AC

## 2023-06-05 MED ORDER — ALBUTEROL SULFATE HFA 108 (90 BASE) MCG/ACT IN AERS: 1.0000 | INHALATION_SPRAY | Freq: Four times a day (QID) | RESPIRATORY_TRACT | 0 refills | Status: AC | PRN

## 2023-06-05 MED ORDER — BENZONATATE 200 MG PO CAPS: 200.0000 mg | ORAL_CAPSULE | Freq: Three times a day (TID) | ORAL | 0 refills | Status: DC | PRN

## 2023-06-05 NOTE — ED Triage Notes (Signed)
Patient c/o cough, congestion, SOB, sore throat from cough x 2 days.  Patient has taken Sudafed, Alka Seltzer Plus, Mucinex, Ibuprofen.
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
## 2023-06-05 NOTE — Discharge Instructions (Addendum)
Start prednisone daily for 5 days.  I have sent you albuterol inhaler to use as needed for shortness of breath.  Start Flovent steroid inhaler twice daily for at least the next 7 days.  You may take Tessalon as needed for cough.  Zithromax is an antibiotic please take as prescribed for your COPD symptoms.  She also take Tamiflu twice daily for 5 days to treat your flu symptoms.  Please follow-up with your PCP in 2 to 3 days for recheck.  Please go to the ER ASAP if you develop any worsening symptoms.  This includes but is not limited to worsening cough or shortness of breath, unable to manage her fevers over the counter ibuprofen or Tylenol, or any new concerns that arise.  Hope you feel better soon!

## 2023-09-27 ENCOUNTER — Ambulatory Visit (HOSPITAL_COMMUNITY): Payer: Self-pay

## 2023-10-21 ENCOUNTER — Ambulatory Visit (HOSPITAL_COMMUNITY): Payer: Self-pay

## 2023-10-22 ENCOUNTER — Encounter (HOSPITAL_COMMUNITY): Payer: Self-pay

## 2023-10-22 ENCOUNTER — Ambulatory Visit (HOSPITAL_COMMUNITY)
Admission: RE | Admit: 2023-10-22 | Discharge: 2023-10-22 | Disposition: A | Discharge status: Home / Self Care | Payer: Self-pay | Source: Ambulatory Visit | Attending: Internal Medicine | Admitting: Internal Medicine

## 2023-10-22 VITALS — BP 126/93 | HR 78 | Temp 98.1°F | Resp 16

## 2023-10-22 DIAGNOSIS — J101 Influenza due to other identified influenza virus with other respiratory manifestations: Secondary | ICD-10-CM

## 2023-10-22 DIAGNOSIS — J069 Acute upper respiratory infection, unspecified: Secondary | ICD-10-CM | POA: Diagnosis not present

## 2023-10-22 DIAGNOSIS — R051 Acute cough: Secondary | ICD-10-CM

## 2023-10-22 HISTORY — DX: Chronic obstructive pulmonary disease, unspecified: J44.9

## 2023-10-22 MED ORDER — PROMETHAZINE-DM 6.25-15 MG/5ML PO SYRP: 5.0000 mL | ORAL_SOLUTION | Freq: Three times a day (TID) | ORAL | 0 refills | Status: AC | PRN

## 2023-10-22 MED ORDER — BENZONATATE 200 MG PO CAPS: 200.0000 mg | ORAL_CAPSULE | Freq: Three times a day (TID) | ORAL | 0 refills | Status: AC | PRN

## 2023-10-22 MED ORDER — FLUTICASONE PROPIONATE 50 MCG/ACT NA SUSP: 2.0000 | Freq: Every day | NASAL | 2 refills | Status: AC

## 2023-10-22 MED ORDER — PREDNISONE 20 MG PO TABS: 40.0000 mg | ORAL_TABLET | Freq: Every day | ORAL | 0 refills | Status: AC

## 2023-10-22 NOTE — Discharge Instructions (Addendum)
 Symptoms are most consistent with a viral infection that is now resolving.  This does not require antibiotic treatment.  We focus treatment on improving the symptoms.  We will treat with the following:  Prednisone  40 mg (2 tablets) once daily for 5 days. Take this in the morning.  This is a steroid to help with inflammation and pain.  Promethazine DM 5 mL every 8 hours as needed for cough.  Use caution as this medication can cause drowsiness. Flonase  2 sprays each nostril once daily for 7 days for nasal congestion.  May use as needed after this. Benzonatate  (tessalon ) 100 mg every 8 hours as needed for cough.  You can take this medication during the day.  Stay hydrated.  Return to urgent care or PCP if symptoms worsen or fail to resolve.
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
## 2023-10-22 NOTE — ED Triage Notes (Signed)
 Patient reports that he has had a productive cough with yellow/gray/cleaar sputum x 5-6 days.  Patient states tht he has been drinking "tea for lungs" and alka Seltzer cold.
